# Patient Record
Sex: Female | Born: 1950
Health system: Southern US, Community
[De-identification: ages and names within clinical notes are randomized; demographics above are authoritative.]

## PROBLEM LIST (undated history)

## (undated) DIAGNOSIS — K219 Gastro-esophageal reflux disease without esophagitis: Secondary | ICD-10-CM

## (undated) DIAGNOSIS — E7800 Pure hypercholesterolemia, unspecified: Secondary | ICD-10-CM

## (undated) DIAGNOSIS — I10 Essential (primary) hypertension: Secondary | ICD-10-CM

## (undated) HISTORY — PX: ABDOMINAL HYSTERECTOMY: SHX81

---

## 2002-06-15 ENCOUNTER — Other Ambulatory Visit: Admission: RE | Admit: 2002-06-15 | Discharge: 2002-06-15 | Payer: Self-pay | Admitting: Obstetrics and Gynecology

## 2003-06-21 ENCOUNTER — Other Ambulatory Visit: Admission: RE | Admit: 2003-06-21 | Discharge: 2003-06-21 | Payer: Self-pay | Admitting: Obstetrics and Gynecology

## 2011-11-03 ENCOUNTER — Emergency Department (HOSPITAL_BASED_OUTPATIENT_CLINIC_OR_DEPARTMENT_OTHER)
Admission: EM | Admit: 2011-11-03 | Discharge: 2011-11-03 | Disposition: A | Discharge status: Home / Self Care | Payer: Managed Care, Other (non HMO) | Attending: Emergency Medicine | Admitting: Emergency Medicine

## 2011-11-03 ENCOUNTER — Emergency Department (INDEPENDENT_AMBULATORY_CARE_PROVIDER_SITE_OTHER): Payer: Managed Care, Other (non HMO)

## 2011-11-03 ENCOUNTER — Encounter: Payer: Self-pay | Admitting: *Deleted

## 2011-11-03 DIAGNOSIS — E7800 Pure hypercholesterolemia, unspecified: Secondary | ICD-10-CM | POA: Insufficient documentation

## 2011-11-03 DIAGNOSIS — W19XXXA Unspecified fall, initial encounter: Secondary | ICD-10-CM

## 2011-11-03 DIAGNOSIS — W010XXA Fall on same level from slipping, tripping and stumbling without subsequent striking against object, initial encounter: Secondary | ICD-10-CM | POA: Insufficient documentation

## 2011-11-03 DIAGNOSIS — M549 Dorsalgia, unspecified: Secondary | ICD-10-CM

## 2011-11-03 DIAGNOSIS — I10 Essential (primary) hypertension: Secondary | ICD-10-CM | POA: Insufficient documentation

## 2011-11-03 DIAGNOSIS — K219 Gastro-esophageal reflux disease without esophagitis: Secondary | ICD-10-CM | POA: Insufficient documentation

## 2011-11-03 DIAGNOSIS — S239XXA Sprain of unspecified parts of thorax, initial encounter: Secondary | ICD-10-CM | POA: Insufficient documentation

## 2011-11-03 DIAGNOSIS — M5137 Other intervertebral disc degeneration, lumbosacral region: Secondary | ICD-10-CM

## 2011-11-03 HISTORY — DX: Gastro-esophageal reflux disease without esophagitis: K21.9

## 2011-11-03 HISTORY — DX: Essential (primary) hypertension: I10

## 2011-11-03 HISTORY — DX: Pure hypercholesterolemia, unspecified: E78.00

## 2011-11-03 MED ORDER — OXYCODONE-ACETAMINOPHEN 5-325 MG PO TABS
1.0000 | ORAL_TABLET | Freq: Once | ORAL | Status: AC
  Administered 2011-11-03: 1 via ORAL
  Filled 2011-11-03: qty 1

## 2011-11-03 MED ORDER — CYCLOBENZAPRINE HCL 10 MG PO TABS: 10.0000 mg | ORAL_TABLET | Freq: Three times a day (TID) | ORAL | Status: AC | PRN

## 2011-11-03 MED ORDER — OXYCODONE-ACETAMINOPHEN 5-325 MG PO TABS: 1.0000 | ORAL_TABLET | ORAL | Status: AC | PRN

## 2011-11-03 NOTE — ED Notes (Signed)
Patient and family members given something to eat and drink, explained to patient and family delay and family expressed concern over not having something to eat during their visit here

## 2011-11-03 NOTE — ED Provider Notes (Signed)
History  Scribed for Karen Roberson, Roberson the patient was seen in [facility]. The chart was scribed by Gilman Schmidt. The patients care was started at 3:49 PM.   CSN: [id number] Arrival date & time: 11/03/2011  2:50 PM    Chief Complaint  Patient presents with  . Back Pain    HPI Karen Roberson is a 60 y.o. female who presents to the Emergency Department complaining of back pain. Pt states she fell getting out of the shower this morning around 9:30AM and injured her mid-back. States she feels a "grabbing sensation in back". Denies any loc, rib pain, neck pain or bleeding. Denies any previous injury. Pt has not tried OTC meds for relief. There are no other associated symptoms and no other alleviating or aggravating factors.   PCP: Dr. Joetta Manners  Past Medical History  Diagnosis Date  . Hypertension   . Hypercholesteremia   . Acid reflux     Past Surgical History  Procedure Date  . Abdominal hysterectomy     History reviewed. No pertinent family history.  History  Substance Use Topics  . Smoking status: Never Smoker   . Smokeless tobacco: Not on file  . Alcohol Use: No     Review of Systems  HENT: Negative for neck pain and neck stiffness.   Cardiovascular: Negative for chest pain.  Gastrointestinal: Negative for nausea, vomiting and diarrhea.  Musculoskeletal: Positive for back pain.  Skin: Negative for wound.  Neurological: Negative for syncope and headaches.  All other systems reviewed and are negative.    Allergies  Review of patient's allergies indicates no known allergies.  Home Medications   Current Outpatient Rx  Name Route Sig Dispense Refill  . ASPIRIN 81 MG PO TABS Oral Take 81 mg by mouth daily.      Marland Kitchen LISINOPRIL 40 MG PO TABS Oral Take 40 mg by mouth daily.      Marland Kitchen OMEPRAZOLE 40 MG PO CPDR Oral Take 40 mg by mouth daily.      Marland Kitchen ROSUVASTATIN CALCIUM 10 MG PO TABS Oral Take 5 mg by mouth daily.        BP 143/82  Pulse 84  Temp(Src) 98.2 F (36.8 C)  (Oral)  Resp 20  Ht 5\' 8"  (1.727 m)  Wt 197 lb (89.359 kg)  BMI 29.95 kg/m2  SpO2 99%  Physical Exam  Constitutional: She is oriented to person, place, and time. She appears well-developed and well-nourished.  Non-toxic appearance. She does not have a sickly appearance.  Eyes: Lids are normal. No scleral icterus.  Neck: Trachea normal and normal range of motion. Neck supple.  Cardiovascular: Regular rhythm and normal heart sounds.   Pulmonary/Chest: Effort normal and breath sounds normal.  Abdominal: Soft. Normal appearance. There is no tenderness. There is no rebound, no guarding and no CVA tenderness.  Musculoskeletal: Normal range of motion.       Thoracic back: She exhibits tenderness.       Lumbar back: She exhibits tenderness.  Neurological: She is alert and oriented to person, place, and time. She has normal strength.       Normal sensation in legs  Skin: Skin is warm, dry and intact. No rash noted.    ED Course  Procedures DIAGNOSTIC STUDIES: Oxygen Saturation is 99% on room air, normal by my interpretation.    COORDINATION OF CARE: 3:49PM:  - Patient evaluated by ED physician, Percocet, DG Thoracic Spine, DG Lumbar Spine ordered 5:40PM: Recheck by EDP. Sx improved. Results  reviewed with pt.   RADIOLOGY:  DG Lumbar Spine 4 View. Reviewed by me. IMPRESSION: No acute or traumatic finding. 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 Chronic lower lumbar facet degeneration including degenerative spondylolisthesis at L5-S1. Original Report Authenticated By: Thomasenia Sales, M.D.  DG Thoracic Spine 4 View. Reviewed by me. IMPRESSION: No acute or traumatic finding. Chronic thoracic osteophytes. Original Report Authenticated By: Thomasenia Sales, M.D.  IMP: Thoracic muscle strain  Disp:  Rx Percocet for pain, Flexeril for muscle spasm, rest on a firm surface, can use a heating pad on a low setting.  I personally performed the services described in this  documentation, which was scribed in my presence. The recorded information has been reviewed and considered.  Osvaldo Human, M.D.      Karen Roberson III, MD 11/03/11 [phone]

## 2011-11-03 NOTE — ED Notes (Signed)
Pt states she fell getting out of the shower today and injured her mid-back. Denies other s/s. Went to church.

## 2012-01-09 IMAGING — CR DG LUMBAR SPINE COMPLETE 4+V
5 series · 5 of 5 positions shown · non-contrast
Comparison: None

CLINICAL DATA: Fall.  Back pain.

LUMBAR SPINE - COMPLETE 4+ VIEW

[t l-spine a.p.]
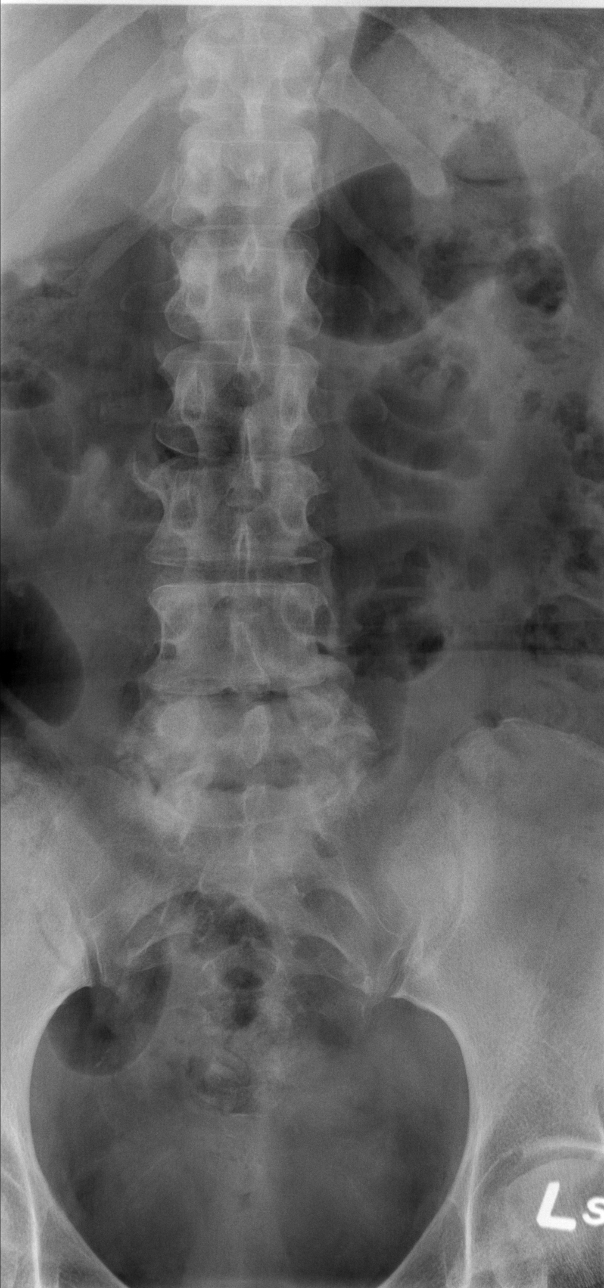

[t l-spine oblique exposure (1 of 2)]
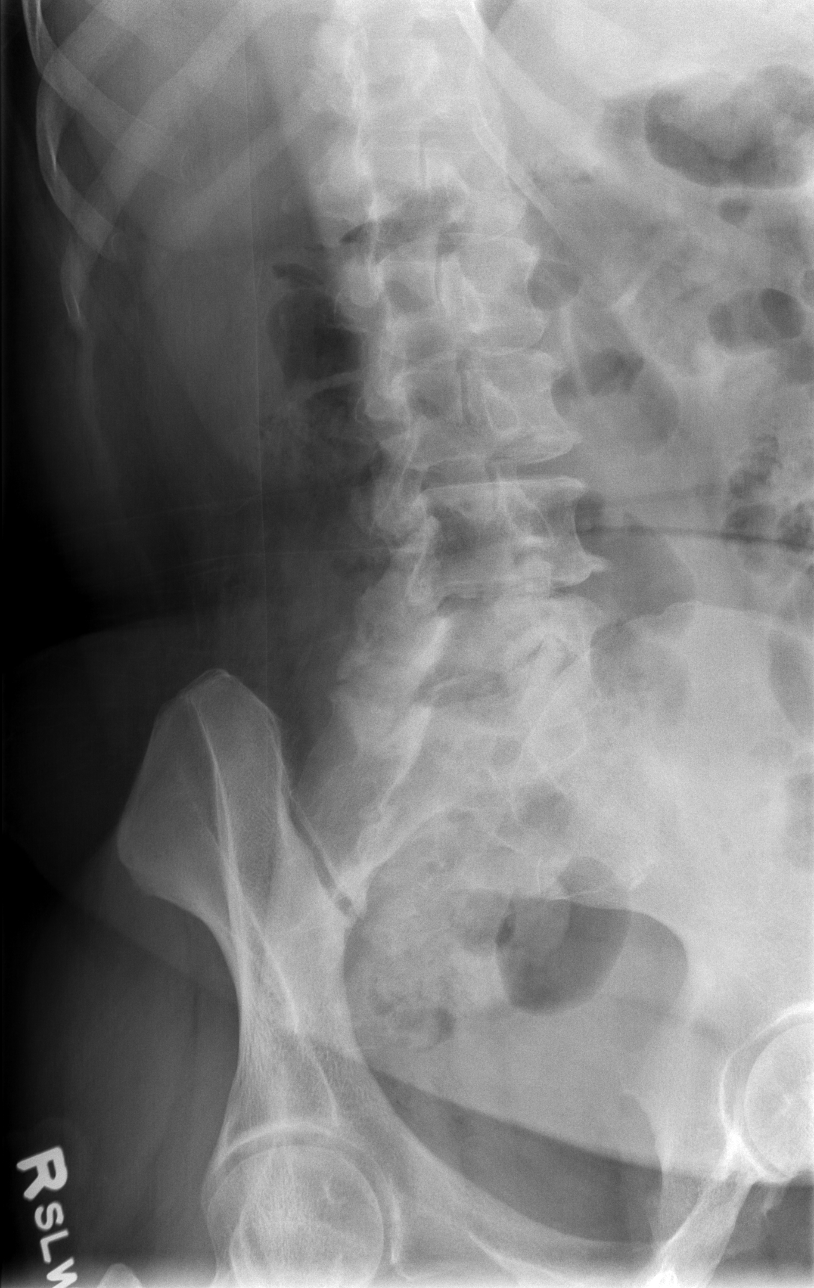

[t l-spine oblique exposure (2 of 2)]
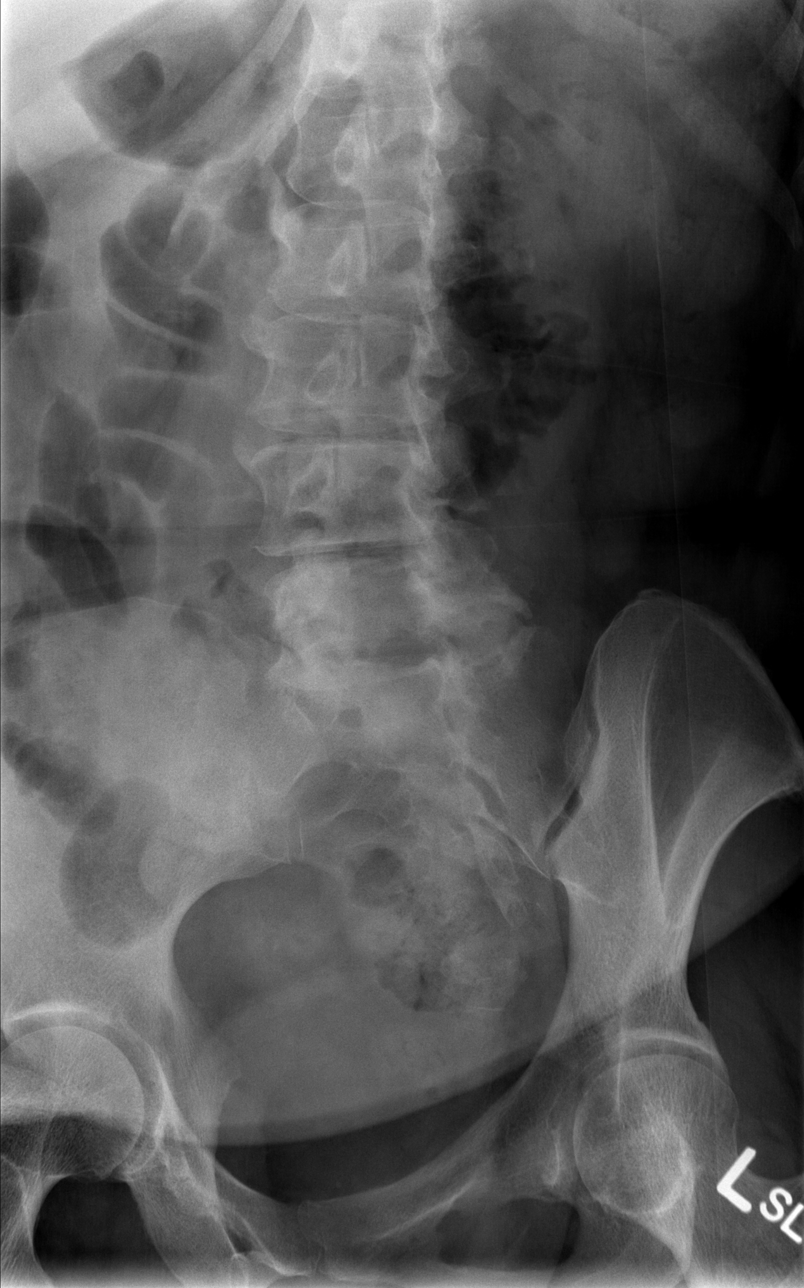

[t l-spine lat]
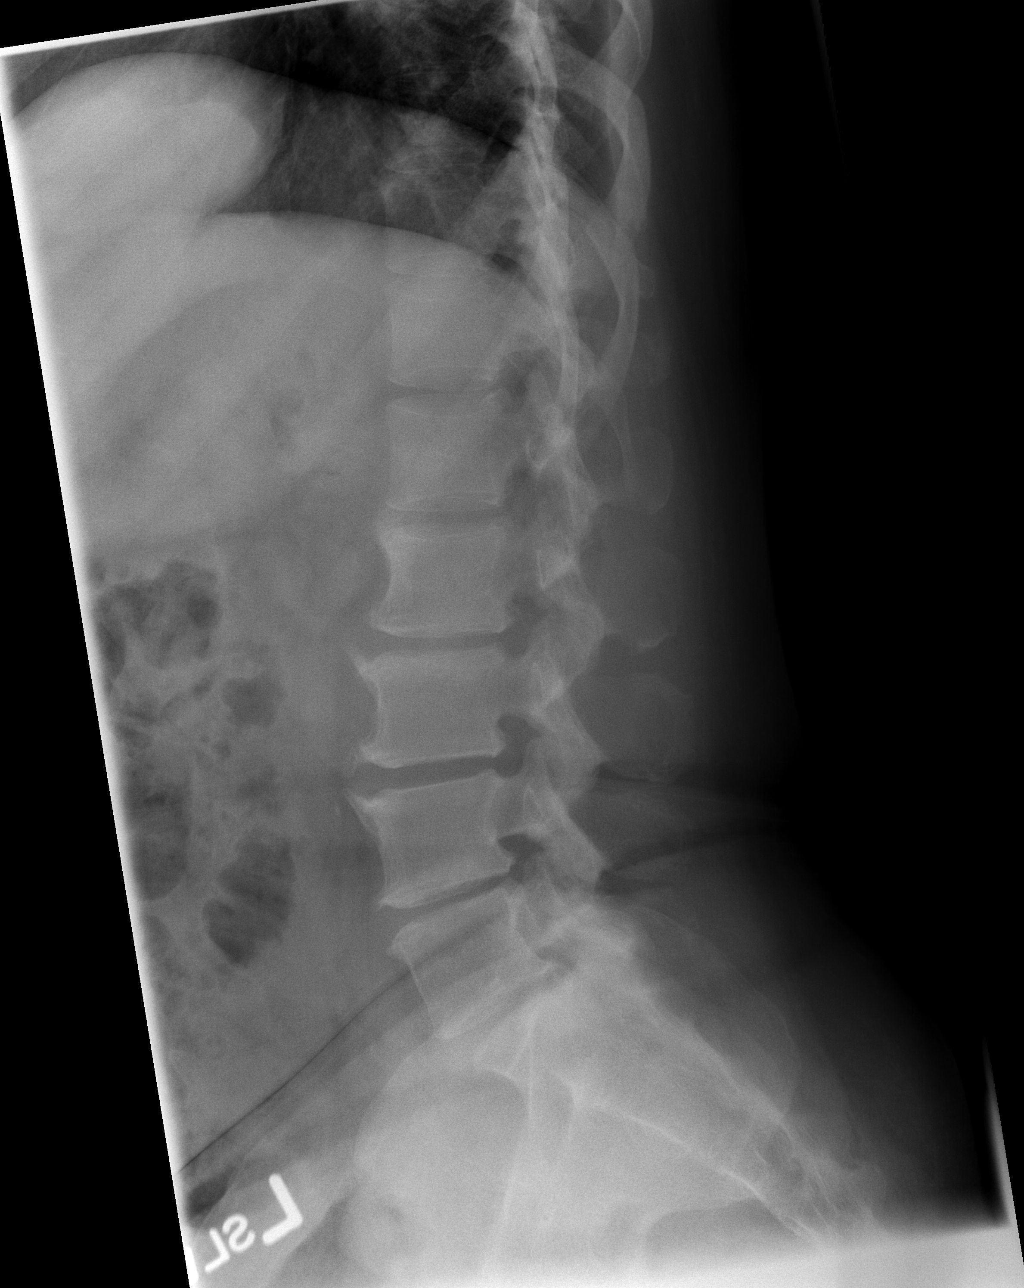

[t l-spine l5-s1 spot]
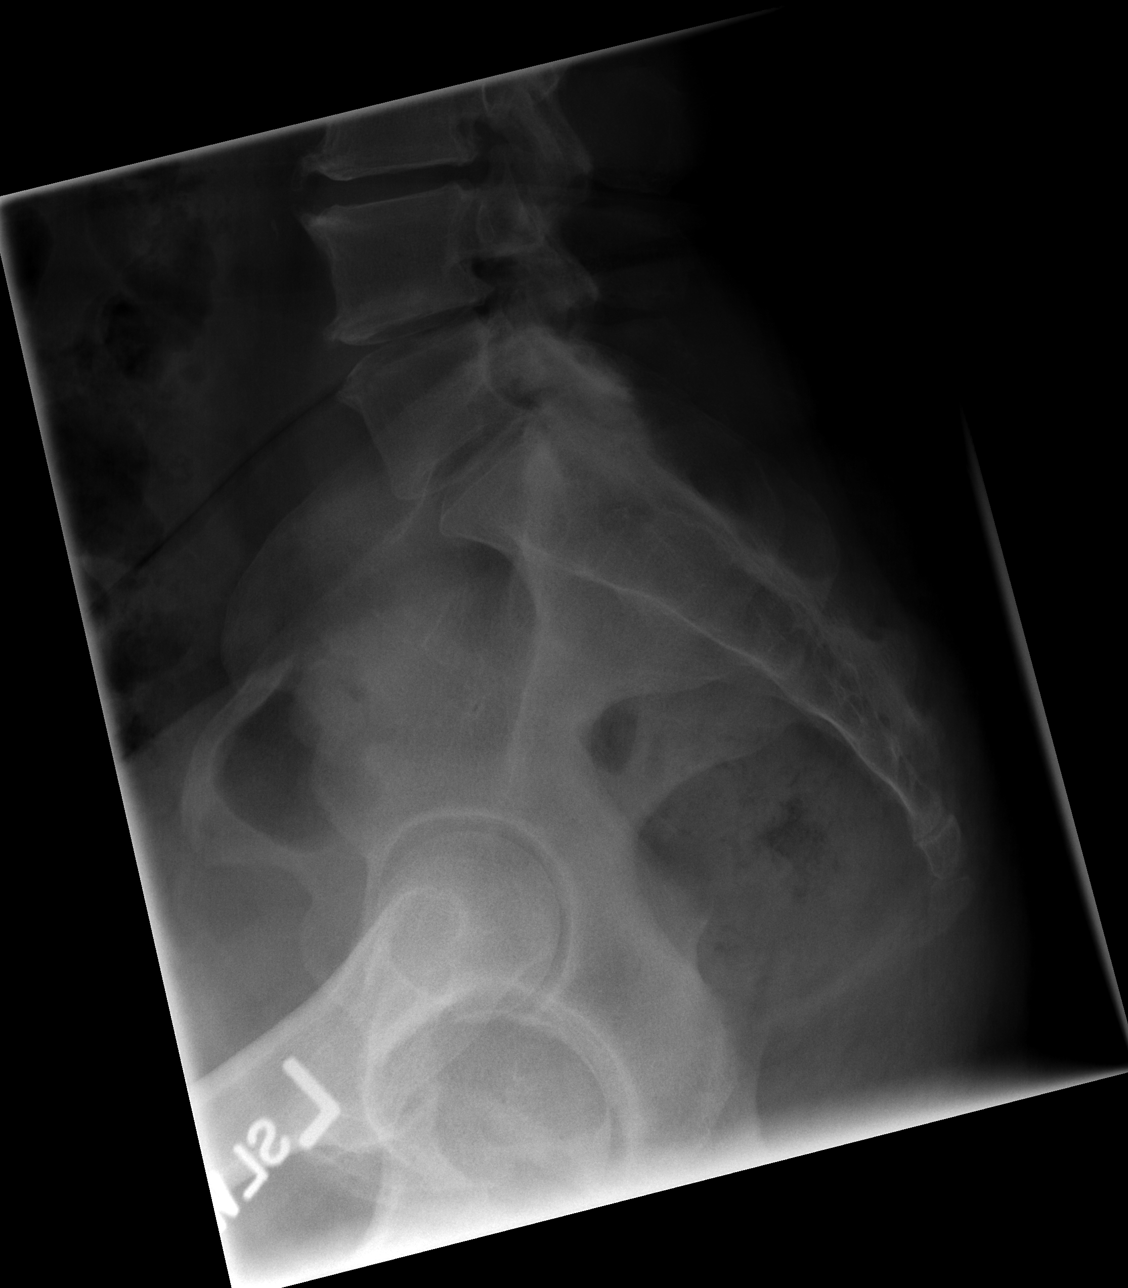

[5 of 5 positions shown; findings below may reference images not displayed]

FINDINGS: No fracture in the lumbar region.  There is disc space
narrowing throughout the lumbar region consistent with chronic
degenerative disc disease.  There is chronic degenerative facet
disease in the lower lumbar spine including fairly advanced facet
arthropathy at L4-5 and L5-S1 with 3 mm of anterolisthesis at L5-
S1.
IMPRESSION: No acute or traumatic finding.  Chronic degenerative disc disease
throughout the lumbar spine.  Chronic lower lumbar facet
degeneration including degenerative spondylolisthesis at L5-S1.

## 2017-01-23 ENCOUNTER — Emergency Department (HOSPITAL_BASED_OUTPATIENT_CLINIC_OR_DEPARTMENT_OTHER)
Admission: EM | Admit: 2017-01-23 | Discharge: 2017-01-23 | Disposition: A | Discharge status: Home / Self Care | Payer: Medicare HMO | Attending: Emergency Medicine | Admitting: Emergency Medicine

## 2017-01-23 ENCOUNTER — Encounter (HOSPITAL_BASED_OUTPATIENT_CLINIC_OR_DEPARTMENT_OTHER): Payer: Self-pay | Admitting: *Deleted

## 2017-01-23 DIAGNOSIS — I10 Essential (primary) hypertension: Secondary | ICD-10-CM | POA: Insufficient documentation

## 2017-01-23 DIAGNOSIS — Z7982 Long term (current) use of aspirin: Secondary | ICD-10-CM | POA: Insufficient documentation

## 2017-01-23 DIAGNOSIS — K529 Noninfective gastroenteritis and colitis, unspecified: Secondary | ICD-10-CM | POA: Insufficient documentation

## 2017-01-23 DIAGNOSIS — Z79899 Other long term (current) drug therapy: Secondary | ICD-10-CM | POA: Insufficient documentation

## 2017-01-23 DIAGNOSIS — R1110 Vomiting, unspecified: Secondary | ICD-10-CM | POA: Diagnosis present

## 2017-01-23 LAB — COMPREHENSIVE METABOLIC PANEL
ALBUMIN: 3.9 g/dL (ref 3.5–5.0)
ALT: 19 U/L (ref 14–54)
AST: 22 U/L (ref 15–41)
Alkaline Phosphatase: 73 U/L (ref 38–126)
Anion gap: 11 (ref 5–15)
BUN: 18 mg/dL (ref 6–20)
CHLORIDE: 103 mmol/L (ref 101–111)
CO2: 25 mmol/L (ref 22–32)
Calcium: 9 mg/dL (ref 8.9–10.3)
Creatinine, Ser: 0.68 mg/dL (ref 0.44–1.00)
GFR calc Af Amer: >60 mL/min (ref >60)
Glucose, Bld: 152 mg/dL — ABNORMAL HIGH (ref 65–99)
POTASSIUM: 3.1 mmol/L — AB (ref 3.5–5.1)
SODIUM: 139 mmol/L (ref 135–145)
Total Bilirubin: 0.6 mg/dL (ref 0.3–1.2)
Total Protein: 8.1 g/dL (ref 6.5–8.1)

## 2017-01-23 LAB — URINALYSIS, MICROSCOPIC (REFLEX)

## 2017-01-23 LAB — URINALYSIS, ROUTINE W REFLEX MICROSCOPIC
GLUCOSE, UA: NEGATIVE mg/dL
Ketones, ur: NEGATIVE mg/dL
LEUKOCYTES UA: NEGATIVE
Nitrite: NEGATIVE
PROTEIN: 100 mg/dL — AB
SPECIFIC GRAVITY, URINE: 1.03 (ref 1.005–1.030)
pH: 6 (ref 5.0–8.0)

## 2017-01-23 LAB — CBC WITH DIFFERENTIAL/PLATELET
BASOS ABS: 0 10*3/uL (ref 0.0–0.1)
BASOS PCT: 0 %
EOS ABS: 0 10*3/uL (ref 0.0–0.7)
EOS PCT: 0 %
HCT: 41.5 % (ref 36.0–46.0)
Hemoglobin: 13.7 g/dL (ref 12.0–15.0)
Lymphocytes Relative: 4 %
Lymphs Abs: 0.3 10*3/uL — ABNORMAL LOW (ref 0.7–4.0)
MCH: 29.8 pg (ref 26.0–34.0)
MCHC: 33 g/dL (ref 30.0–36.0)
MCV: 90.2 fL (ref 78.0–100.0)
MONO ABS: 0.3 10*3/uL (ref 0.1–1.0)
Monocytes Relative: 4 %
Neutro Abs: 7.5 10*3/uL (ref 1.7–7.7)
Neutrophils Relative %: 92 %
PLATELETS: 310 10*3/uL (ref 150–400)
RBC: 4.6 MIL/uL (ref 3.87–5.11)
RDW: 13.2 % (ref 11.5–15.5)
WBC: 8.1 10*3/uL (ref 4.0–10.5)

## 2017-01-23 LAB — LIPASE, BLOOD: LIPASE: 26 U/L (ref 11–51)

## 2017-01-23 LAB — I-STAT CG4 LACTIC ACID, ED: Lactic Acid, Venous: 1.58 mmol/L (ref 0.5–1.9)

## 2017-01-23 MED ORDER — DICYCLOMINE HCL 20 MG PO TABS: 20.0000 mg | ORAL_TABLET | Freq: Two times a day (BID) | ORAL | 0 refills | Status: AC | PRN

## 2017-01-23 MED ORDER — ONDANSETRON HCL 4 MG/2ML IJ SOLN
4.0000 mg | Freq: Once | INTRAMUSCULAR | Status: AC
  Administered 2017-01-23: 4 mg via INTRAVENOUS
  Filled 2017-01-23: qty 2

## 2017-01-23 MED ORDER — DICYCLOMINE HCL 10 MG/ML IM SOLN
20.0000 mg | Freq: Once | INTRAMUSCULAR | Status: AC
  Administered 2017-01-23: 20 mg via INTRAMUSCULAR
  Filled 2017-01-23: qty 2

## 2017-01-23 MED ORDER — ONDANSETRON 4 MG PO TBDP: 4.0000 mg | ORAL_TABLET | Freq: Three times a day (TID) | ORAL | 0 refills | Status: AC | PRN

## 2017-01-23 MED ORDER — SODIUM CHLORIDE 0.9 % IV BOLUS (SEPSIS)
1000.0000 mL | INTRAVENOUS | Status: AC
  Administered 2017-01-23: 1000 mL via INTRAVENOUS

## 2017-01-23 MED ORDER — KETOROLAC TROMETHAMINE 15 MG/ML IJ SOLN
15.0000 mg | Freq: Once | INTRAMUSCULAR | Status: AC
  Administered 2017-01-23: 15 mg via INTRAVENOUS
  Filled 2017-01-23: qty 1

## 2017-01-23 MED FILL — ONDANSETRON ODT 4 MG TABLET: 4 | 4 days supply | Qty: 10 | Fill #0

## 2017-01-23 MED FILL — DICYCLOMINE 20 MG TABLET: 20 | 10 days supply | Qty: 20 | Fill #0

## 2017-01-23 NOTE — Discharge Instructions (Signed)
Drink plenty of clear liquids to prevent dehydration. Use Zofran as prescribed for nausea/vomiting. You may take Bentyl or over-the-counter Tylenol for abdominal cramping. Follow-up with your primary care doctor to ensure resolution of symptoms. You may return for new or concerning symptoms.

## 2017-01-23 NOTE — ED Notes (Signed)
Pt was able to drink gingerale.

## 2017-01-23 NOTE — ED Provider Notes (Signed)
[facility]   CSN: [id number] Arrival date & time: 01/23/17  0900     History   Chief Complaint Chief Complaint  Patient presents with  . Emesis    HPI Karen Roberson is a 65 y.o. female.  66 year old female with a history of dyslipidemia and hypertension presents to the emergency department for evaluation of vomiting and diarrhea which began at midnight. She reports approximately 6 episodes of vomiting and 2 episodes of watery, nonbloody diarrhea. Patient has not taken any medications for symptoms, though she was able to tolerate her blood pressure medication this morning. She tried drinking ginger ale, but vomited shortly after this. She denies any recent sick contacts. No known fevers. No urinary complaints. She does report some abdominal discomfort primarily in her epigastrium. She has an abdominal surgical history significant for abdominal hysterectomy and appendectomy.   The history is provided by the patient and a relative. No language interpreter was used.  Emesis      Past Medical History:  Diagnosis Date  . Acid reflux   . Hypercholesteremia   . Hypertension     There are no active problems to display for this patient.   Past Surgical History:  Procedure Laterality Date  . ABDOMINAL HYSTERECTOMY      OB History    No data available       Home Medications    Prior to Admission medications   Medication Sig Start Date End Date Taking? Authorizing Provider  simvastatin (ZOCOR) 10 MG tablet Take 10 mg by mouth daily.   Yes Historical Provider, MD  aspirin 81 MG tablet Take 81 mg by mouth daily.      Historical Provider, MD  dicyclomine (BENTYL) 20 MG tablet Take 1 tablet (20 mg total) by mouth 2 (two) times daily as needed (abdominal pain/cramping). 01/23/17   Antony Madura, PA-C  lisinopril (PRINIVIL,ZESTRIL) 40 MG tablet Take 40 mg by mouth daily.      Historical Provider, MD  ondansetron (ZOFRAN ODT) 4 MG disintegrating tablet Take  1 tablet (4 mg total) by mouth every 8 (eight) hours as needed for nausea or vomiting. 01/23/17   Antony Madura, PA-C    Family History History reviewed. No pertinent family history.  Social History Social History  Substance Use Topics  . Smoking status: Never Smoker  . Smokeless tobacco: Never Used  . Alcohol use No     Allergies   Patient has no known allergies.   Review of Systems Review of Systems  Gastrointestinal: Positive for vomiting.  Ten systems reviewed and are negative for acute change, except as noted in the HPI.     Physical Exam Updated Vital Signs BP 139/78 (BP Location: Left Arm)   Pulse 105   Temp 99.4 F (37.4 C) (Oral)   Resp 18   Ht 5' 8.25" (1.734 m)   Wt 93 kg   SpO2 96%   BMI 30.94 kg/m   Physical Exam  Constitutional: She is oriented to person, place, and time. She appears well-developed and well-nourished. No distress.  HENT:  Head: Normocephalic and atraumatic.  Eyes: Conjunctivae and EOM are normal. No scleral icterus.  Neck: Normal range of motion.  Cardiovascular: Regular rhythm and intact distal pulses.   Tachycardia  Pulmonary/Chest: Effort normal. No respiratory distress. She has no wheezes. She has no rales.  Lungs grossly clear bilaterally. Chest expansion symmetric  Abdominal: Soft. She exhibits no mass. There is tenderness. There is no guarding.  Soft, obese abdomen. There is tenderness to palpation in the epigastric region. No masses or peritoneal signs.  Musculoskeletal: Normal range of motion.  Neurological: She is alert and oriented to person, place, and time. She exhibits normal muscle tone. Coordination normal.  GCS 15. Patient moving all extremities.  Skin: Skin is warm and dry. No rash noted. She is not diaphoretic. No erythema. No pallor.  Psychiatric: She has a normal mood and affect. Her behavior is normal.  Nursing note and vitals reviewed.    ED Treatments / Results  Labs (all labs ordered are listed, but  only abnormal results are displayed) Labs Reviewed  CBC WITH DIFFERENTIAL/PLATELET - Abnormal; Notable for the following:       Result Value   Lymphs Abs 0.3 (*)    All other components within normal limits  COMPREHENSIVE METABOLIC PANEL - Abnormal; Notable for the following:    Potassium 3.1 (*)    Glucose, Bld 152 (*)    All other components within normal limits  URINALYSIS, ROUTINE W REFLEX MICROSCOPIC - Abnormal; Notable for the following:    APPearance CLOUDY (*)    Hgb urine dipstick LARGE (*)    Bilirubin Urine SMALL (*)    Protein, ur 100 (*)    All other components within normal limits  URINALYSIS, MICROSCOPIC (REFLEX) - Abnormal; Notable for the following:    Bacteria, UA MANY (*)    Squamous Epithelial / LPF 0-5 (*)    All other components within normal limits  LIPASE, BLOOD  I-STAT CG4 LACTIC ACID, ED  I-STAT CG4 LACTIC ACID, ED    EKG  EKG Interpretation None       Radiology No results found.  Procedures Procedures (including critical care time)  Medications Ordered in ED Medications  sodium chloride 0.9 % bolus 1,000 mL (0 mLs Intravenous Stopped 01/23/17 1058)  ondansetron (ZOFRAN) injection 4 mg (4 mg Intravenous Given 01/23/17 0950)  ketorolac (TORADOL) 15 MG/ML injection 15 mg (15 mg Intravenous Given 01/23/17 1028)  sodium chloride 0.9 % bolus 1,000 mL (1,000 mLs Intravenous New Bag/Given 01/23/17 1058)  dicyclomine (BENTYL) injection 20 mg (20 mg Intramuscular Given 01/23/17 1059)     Initial Impression / Assessment and Plan / ED Course  I have reviewed the triage vital signs and the nursing notes.  Pertinent labs & imaging results that were available during my care of the patient were reviewed by me and considered in my medical decision making (see chart for details).     10:49 AM Patient reassessed. Labs reassuring. She reports lessening of epigastric abdominal pain. Abdominal exam stable. HR improving with IVF. Additional bolus  ordered.  12:32 PM Patient expresses to RN that she is feeling better. She is tolerating POs without vomiting. HR has greatly improved with IVF. Symptoms consistent with viral gastroenteritis. No fever. Lungs are clear. Labs reassuring. No leukocytosis. Supportive therapy indicated with return if symptoms worsen. PCP f/u advised and return precautions given. Patient discharged in stable condition with no unaddressed concerns.   Final Clinical Impressions(s) / ED Diagnoses   Final diagnoses:  Gastroenteritis    New Prescriptions New Prescriptions   DICYCLOMINE (BENTYL) 20 MG TABLET    Take 1 tablet (20 mg total) by mouth 2 (two) times daily as needed (abdominal pain/cramping).   ONDANSETRON (ZOFRAN ODT) 4 MG DISINTEGRATING TABLET    Take 1 tablet (4 mg total) by mouth every 8 (eight) hours as needed for nausea or vomiting.     Antony MaduraKelly Leroy Trim, PA-C  01/23/17 1234    Jerelyn Scott, MD 01/23/17 1245

## 2017-01-23 NOTE — ED Triage Notes (Signed)
Pt reports sudden onset of vomiting and diarrhea at around midnight. Pt also reports abd is "sore all over".

## 2018-02-18 ENCOUNTER — Other Ambulatory Visit: Payer: Self-pay

## 2018-02-18 ENCOUNTER — Emergency Department (HOSPITAL_BASED_OUTPATIENT_CLINIC_OR_DEPARTMENT_OTHER)
Admission: EM | Admit: 2018-02-18 | Discharge: 2018-02-18 | Disposition: A | Discharge status: Home / Self Care | Payer: Medicare HMO | Attending: Emergency Medicine | Admitting: Emergency Medicine

## 2018-02-18 ENCOUNTER — Encounter (HOSPITAL_BASED_OUTPATIENT_CLINIC_OR_DEPARTMENT_OTHER): Payer: Self-pay | Admitting: Emergency Medicine

## 2018-02-18 DIAGNOSIS — Z7982 Long term (current) use of aspirin: Secondary | ICD-10-CM | POA: Diagnosis not present

## 2018-02-18 DIAGNOSIS — R42 Dizziness and giddiness: Secondary | ICD-10-CM | POA: Diagnosis present

## 2018-02-18 DIAGNOSIS — I10 Essential (primary) hypertension: Secondary | ICD-10-CM | POA: Insufficient documentation

## 2018-02-18 DIAGNOSIS — H81399 Other peripheral vertigo, unspecified ear: Secondary | ICD-10-CM | POA: Diagnosis not present

## 2018-02-18 DIAGNOSIS — Z79899 Other long term (current) drug therapy: Secondary | ICD-10-CM | POA: Diagnosis not present

## 2018-02-18 DIAGNOSIS — R0981 Nasal congestion: Secondary | ICD-10-CM | POA: Insufficient documentation

## 2018-02-18 LAB — CBC WITH DIFFERENTIAL/PLATELET
Basophils Absolute: 0 10*3/uL (ref 0.0–0.1)
Basophils Relative: 0 %
Eosinophils Absolute: 0.1 10*3/uL (ref 0.0–0.7)
Eosinophils Relative: 1 %
HCT: 38.1 % (ref 36.0–46.0)
Hemoglobin: 12.4 g/dL (ref 12.0–15.0)
Lymphocytes Relative: 34 %
Lymphs Abs: 2 10*3/uL (ref 0.7–4.0)
MCH: 30 pg (ref 26.0–34.0)
MCHC: 32.5 g/dL (ref 30.0–36.0)
MCV: 92.3 fL (ref 78.0–100.0)
Monocytes Absolute: 0.5 10*3/uL (ref 0.1–1.0)
Monocytes Relative: 8 %
Neutro Abs: 3.3 10*3/uL (ref 1.7–7.7)
Neutrophils Relative %: 57 %
Platelets: 325 10*3/uL (ref 150–400)
RBC: 4.13 MIL/uL (ref 3.87–5.11)
RDW: 13.8 % (ref 11.5–15.5)
WBC: 5.9 10*3/uL (ref 4.0–10.5)

## 2018-02-18 LAB — URINALYSIS, ROUTINE W REFLEX MICROSCOPIC
Bilirubin Urine: NEGATIVE
Glucose, UA: NEGATIVE mg/dL
Ketones, ur: NEGATIVE mg/dL
Leukocytes, UA: NEGATIVE
Nitrite: NEGATIVE
Protein, ur: NEGATIVE mg/dL
Specific Gravity, Urine: 1.01 (ref 1.005–1.030)
pH: 6 (ref 5.0–8.0)

## 2018-02-18 LAB — BASIC METABOLIC PANEL
Anion gap: 8 (ref 5–15)
BUN: 14 mg/dL (ref 6–20)
CO2: 29 mmol/L (ref 22–32)
Calcium: 9 mg/dL (ref 8.9–10.3)
Chloride: 100 mmol/L — ABNORMAL LOW (ref 101–111)
Creatinine, Ser: 0.87 mg/dL (ref 0.44–1.00)
GFR calc Af Amer: >60 mL/min (ref >60)
GFR calc non Af Amer: >60 mL/min (ref >60)
Glucose, Bld: 96 mg/dL (ref 65–99)
Potassium: 3.5 mmol/L (ref 3.5–5.1)
Sodium: 137 mmol/L (ref 135–145)

## 2018-02-18 LAB — URINALYSIS, MICROSCOPIC (REFLEX): WBC, UA: NONE SEEN WBC/hpf (ref 0–5)

## 2018-02-18 MED ORDER — DIAZEPAM 5 MG PO TABS: 5.0000 mg | ORAL_TABLET | Freq: Two times a day (BID) | ORAL | 0 refills | Status: AC | PRN

## 2018-02-18 MED ORDER — FLUTICASONE PROPIONATE 50 MCG/ACT NA SUSP: 2.0000 | Freq: Every day | NASAL | 0 refills | Status: AC

## 2018-02-18 MED ORDER — DIAZEPAM 5 MG PO TABS
5.0000 mg | ORAL_TABLET | Freq: Once | ORAL | Status: AC
  Administered 2018-02-18: 5 mg via ORAL
  Filled 2018-02-18: qty 1

## 2018-02-18 MED ORDER — CETIRIZINE HCL 10 MG PO TABS: 10.0000 mg | ORAL_TABLET | Freq: Every day | ORAL | 0 refills | Status: AC

## 2018-02-18 MED FILL — FLUTICASONE PROP 50 MCG SPR: 50 | 30 days supply | Qty: 16 | Fill #0

## 2018-02-18 MED FILL — CETIRIZINE HCL 10 MG TABS: 10 | 100 days supply | Qty: 100 | Fill #0

## 2018-02-18 MED FILL — diazePAM 5 MG TABS: 5 | 5 days supply | Qty: 10 | Fill #0

## 2018-02-18 NOTE — ED Provider Notes (Signed)
[facility] EMERGENCY DEPARTMENT Provider Note   CSN: [id number] Arrival date & time: 02/18/18  1133     History   Chief Complaint Chief Complaint  Patient presents with  . Dizziness    HPI Karen Roberson is a 67 y.o. female with history of hypertension, acid reflux who presents with recurrent dizziness.  Patient reports her symptoms started yesterday, however she had an episode of the same last month.  Patient has had room spinning dizziness with movement of her head.  At this time, she reports some mild lightheadedness, however she moves her head the vertigo begins.  She denies any numbness or tingling.  She has a slight headache.  She has taken over-the-counter meclizine with some relief, however she states she cannot work with her symptoms like this in once the vertigo completely gone.  She has had associated nasal congestion.  She saw her doctor last month who recommended meclizine.  HPI  Past Medical History:  Diagnosis Date  . Acid reflux   . Hypercholesteremia   . Hypertension     There are no active problems to display for this patient.   Past Surgical History:  Procedure Laterality Date  . ABDOMINAL HYSTERECTOMY      OB History    No data available       Home Medications    Prior to Admission medications   Medication Sig Start Date End Date Taking? Authorizing Provider  aspirin 81 MG tablet Take 81 mg by mouth daily.      [provider]  cetirizine (ZYRTEC ALLERGY) 10 MG tablet Take 1 tablet (10 mg total) by mouth daily. 02/18/18   Adriyanna Christians, Waylan BogaAlexandra M, PA-C  diazepam (VALIUM) 5 MG tablet Take 1 tablet (5 mg total) by mouth every 12 (twelve) hours as needed (dizziness). 02/18/18   Dalynn Jhaveri, Waylan BogaAlexandra M, PA-C  dicyclomine (BENTYL) 20 MG tablet Take 1 tablet (20 mg total) by mouth 2 (two) times daily as needed (abdominal pain/cramping). 01/23/17   Antony MaduraHumes, Kelly, PA-C  fluticasone (FLONASE) 50 MCG/ACT nasal spray Place 2 sprays into both nostrils daily.  02/18/18   Kaydee Magel, Waylan BogaAlexandra M, PA-C  lisinopril (PRINIVIL,ZESTRIL) 40 MG tablet Take 40 mg by mouth daily.      [provider]  ondansetron (ZOFRAN ODT) 4 MG disintegrating tablet Take 1 tablet (4 mg total) by mouth every 8 (eight) hours as needed for nausea or vomiting. 01/23/17   Antony MaduraHumes, Kelly, PA-C  simvastatin (ZOCOR) 10 MG tablet Take 10 mg by mouth daily.    [provider]    Family History No family history on file.  Social History Social History   Tobacco Use  . Smoking status: Never Smoker  . Smokeless tobacco: Never Used  Substance Use Topics  . Alcohol use: No  . Drug use: No     Allergies   Patient has no known allergies.   Review of Systems Review of Systems  Constitutional: Negative for chills and fever.  HENT: Negative for facial swelling and sore throat.   Respiratory: Negative for shortness of breath.   Cardiovascular: Negative for chest pain.  Gastrointestinal: Negative for abdominal pain, nausea and vomiting.  Genitourinary: Negative for dysuria.  Musculoskeletal: Negative for back pain.  Skin: Negative for rash and wound.  Neurological: Positive for dizziness, light-headedness and headaches. Negative for speech difficulty, weakness and numbness.  Psychiatric/Behavioral: The patient is not nervous/anxious.      Physical Exam Updated Vital Signs BP 121/68 (BP Location: Right Arm)  Pulse 78   Temp 97.9 F (36.6 C) (Oral)   Resp 16   Ht 5\' 8"  (1.727 m)   Wt 94.3 kg (207 lb 14.3 oz)   SpO2 98%   BMI 31.61 kg/m   Physical Exam  Constitutional: She appears well-developed and well-nourished. No distress.  HENT:  Head: Normocephalic and atraumatic.  Right Ear: Tympanic membrane is not injected, not erythematous, not retracted and not bulging. A middle ear effusion is present.  Left Ear: Tympanic membrane is not injected, not erythematous, not retracted and not bulging. A middle ear effusion is present.  Mouth/Throat: Oropharynx is  clear and moist. No oropharyngeal exudate.  Bilateral fluid effusions; no signs of otitis media  Eyes: Conjunctivae are normal. Pupils are equal, round, and reactive to light. Right eye exhibits no discharge. Left eye exhibits no discharge. No scleral icterus.  Neck: Normal range of motion. Neck supple. No thyromegaly present.  Cardiovascular: Normal rate, regular rhythm, normal heart sounds and intact distal pulses. Exam reveals no gallop and no friction rub.  No murmur heard. Pulmonary/Chest: Effort normal and breath sounds normal. No stridor. No respiratory distress. She has no wheezes. She has no rales.  Abdominal: Soft. Bowel sounds are normal. She exhibits no distension. There is no tenderness. There is no rebound and no guarding.  Musculoskeletal: She exhibits no edema.  Lymphadenopathy:    She has no cervical adenopathy.  Neurological: She is alert. Coordination normal.  CN 3-12 intact; no nystagmus noted; normal sensation throughout; 5/5 strength in all 4 extremities; equal bilateral grip strength; no ataxia on finger to nose  Skin: Skin is warm and dry. No rash noted. She is not diaphoretic. No pallor.  Psychiatric: She has a normal mood and affect.  Nursing note and vitals reviewed.    ED Treatments / Results  Labs (all labs ordered are listed, but only abnormal results are displayed) Labs Reviewed  BASIC METABOLIC PANEL - Abnormal; Notable for the following components:      Result Value   Chloride 100 (*)    All other components within normal limits  URINALYSIS, ROUTINE W REFLEX MICROSCOPIC - Abnormal; Notable for the following components:   Hgb urine dipstick SMALL (*)    All other components within normal limits  URINALYSIS, MICROSCOPIC (REFLEX) - Abnormal; Notable for the following components:   Bacteria, UA RARE (*)    Squamous Epithelial / LPF 0-5 (*)    All other components within normal limits  CBC WITH DIFFERENTIAL/PLATELET    EKG  EKG Interpretation None         Radiology No results found.  Procedures Procedures (including critical care time)  Medications Ordered in ED Medications  diazepam (VALIUM) tablet 5 mg (5 mg Oral Given 02/18/18 1426)     Initial Impression / Assessment and Plan / ED Course  I have reviewed the triage vital signs and the nursing notes.  Pertinent labs & imaging results that were available during my care of the patient were reviewed by me and considered in my medical decision making (see chart for details).     Patient with suspected peripheral vertigo potentially related to chronic nasal congestion and ear congestion.  Symptoms are not worsened with head movement.  Normal neuro exam without focal deficits.  No ataxia noted.  Resolved with Valium in the ED.  Will discharge home with Zyrtec, Flonase, Valium with follow-up to ENT.  Strict return precautions given.  Patient understands and agrees with plan.  Patient vitals stable throughout  ED course and discharged in satisfactory condition.  Patient also evaluated by Dr. Erma Heritage who guided the patient's management and agrees with plan.  Final Clinical Impressions(s) / ED Diagnoses   Final diagnoses:  Nasal congestion  Peripheral vertigo, unspecified laterality  Vertigo    ED Discharge Orders        Ordered    diazepam (VALIUM) 5 MG tablet  Every 12 hours PRN     02/18/18 1505    fluticasone (FLONASE) 50 MCG/ACT nasal spray  Daily     02/18/18 1505    cetirizine (ZYRTEC ALLERGY) 10 MG tablet  Daily     02/18/18 [address], PA-C 02/18/18 1513    Shaune Pollack, MD 02/19/18 [phone]

## 2018-02-18 NOTE — Discharge Instructions (Signed)
Medications: Flonase, Zyrtec, Valium  Treatment: Take Flonase once daily as prescribed for nasal congestion.  Take Zyrtec 1-2 times daily as needed for nasal congestion and ear congestion.  Take Valium twice daily as needed for dizziness.  Do not drive or operate machinery while taking this medication (Valium).  Follow-up: Please follow-up with the ear nose and throat doctor, Dr. Jearld FentonByers, for further evaluation and treatment of your dizziness.  Please return to the emergency department if you develop any new or worsening symptoms.

## 2018-02-18 NOTE — ED Triage Notes (Signed)
Pt having lightheadedness, and dizziness for two days.  Pt states that she took some meclizine at home this am and has improved some.  Pt states "I want it gone".  Negative BEFAST,  Negative LVO

## 2019-03-06 ENCOUNTER — Other Ambulatory Visit: Payer: Self-pay

## 2019-03-06 ENCOUNTER — Emergency Department (HOSPITAL_BASED_OUTPATIENT_CLINIC_OR_DEPARTMENT_OTHER): Payer: Medicare HMO

## 2019-03-06 ENCOUNTER — Emergency Department (HOSPITAL_BASED_OUTPATIENT_CLINIC_OR_DEPARTMENT_OTHER)
Admission: EM | Admit: 2019-03-06 | Discharge: 2019-03-06 | Disposition: A | Discharge status: Home / Self Care | Payer: Medicare HMO | Attending: Emergency Medicine | Admitting: Emergency Medicine

## 2019-03-06 ENCOUNTER — Encounter (HOSPITAL_BASED_OUTPATIENT_CLINIC_OR_DEPARTMENT_OTHER): Payer: Self-pay | Admitting: Emergency Medicine

## 2019-03-06 DIAGNOSIS — M79605 Pain in left leg: Secondary | ICD-10-CM | POA: Insufficient documentation

## 2019-03-06 DIAGNOSIS — I10 Essential (primary) hypertension: Secondary | ICD-10-CM | POA: Insufficient documentation

## 2019-03-06 DIAGNOSIS — R1032 Left lower quadrant pain: Secondary | ICD-10-CM | POA: Diagnosis present

## 2019-03-06 DIAGNOSIS — Z7982 Long term (current) use of aspirin: Secondary | ICD-10-CM | POA: Diagnosis not present

## 2019-03-06 DIAGNOSIS — Z79899 Other long term (current) drug therapy: Secondary | ICD-10-CM | POA: Insufficient documentation

## 2019-03-06 DIAGNOSIS — R109 Unspecified abdominal pain: Secondary | ICD-10-CM

## 2019-03-06 LAB — URINALYSIS, MICROSCOPIC (REFLEX): WBC, UA: NONE SEEN WBC/hpf (ref 0–5)

## 2019-03-06 LAB — URINALYSIS, ROUTINE W REFLEX MICROSCOPIC
BILIRUBIN URINE: NEGATIVE
Glucose, UA: NEGATIVE mg/dL
Ketones, ur: NEGATIVE mg/dL
Leukocytes,Ua: NEGATIVE
NITRITE: NEGATIVE
PH: 7 (ref 5.0–8.0)
Protein, ur: NEGATIVE mg/dL
SPECIFIC GRAVITY, URINE: 1.02 (ref 1.005–1.030)

## 2019-03-06 MED ORDER — OXYCODONE-ACETAMINOPHEN 5-325 MG PO TABS
1.0000 | ORAL_TABLET | Freq: Once | ORAL | Status: AC
  Administered 2019-03-06: 1 via ORAL
  Filled 2019-03-06: qty 1

## 2019-03-06 MED ORDER — ACETAMINOPHEN 325 MG PO TABS
650.0000 mg | ORAL_TABLET | Freq: Once | ORAL | Status: AC
  Administered 2019-03-06: 650 mg via ORAL
  Filled 2019-03-06: qty 2

## 2019-03-06 NOTE — ED Provider Notes (Signed)
[facility] EMERGENCY DEPARTMENT Provider Note   CSN: [id number] Arrival date & time: 03/06/19  2836    History   Chief Complaint Chief Complaint  Patient presents with  . Back Pain    HPI Karen Roberson is a 67 y.o. female.     HPI [age]-year-old female presents the emergency department with left lower quadrant abdominal pain with some radiation to the left anterior thigh in the left flank.  No prior history of kidney stones.  Pain is constant.  Her symptoms have been bothering her since yesterday.  Denies nausea vomiting.  No fevers or chills.  No dysuria or urinary frequency.  No posterior buttock or thigh pain.  No weakness of the leg.  No pain down into her lower leg.  Simply left groin and left proximal anterior thigh pain.  No rash.   Past Medical History:  Diagnosis Date  . Acid reflux   . Hypercholesteremia   . Hypertension     There are no active problems to display for this patient.   Past Surgical History:  Procedure Laterality Date  . ABDOMINAL HYSTERECTOMY       OB History   No obstetric history on file.      Home Medications    Prior to Admission medications   Medication Sig Start Date End Date Taking? Authorizing Provider  aspirin 81 MG tablet Take 81 mg by mouth daily.     Yes [provider]  gabapentin (NEURONTIN) 300 MG capsule Take 300 mg by mouth 3 (three) times daily.   Yes [provider]  cetirizine (ZYRTEC ALLERGY) 10 MG tablet Take 1 tablet (10 mg total) by mouth daily. 02/18/18   Law, Waylan Boga, PA-C  diazepam (VALIUM) 5 MG tablet Take 1 tablet (5 mg total) by mouth every 12 (twelve) hours as needed (dizziness). 02/18/18   Law, Waylan Boga, PA-C  dicyclomine (BENTYL) 20 MG tablet Take 1 tablet (20 mg total) by mouth 2 (two) times daily as needed (abdominal pain/cramping). 01/23/17   Antony Madura, PA-C  fluticasone (FLONASE) 50 MCG/ACT nasal spray Place 2 sprays into both nostrils daily. 02/18/18   Law, Waylan Boga, PA-C  lisinopril (PRINIVIL,ZESTRIL) 40 MG tablet Take 40 mg by mouth daily.      [provider]  ondansetron (ZOFRAN ODT) 4 MG disintegrating tablet Take 1 tablet (4 mg total) by mouth every 8 (eight) hours as needed for nausea or vomiting. 01/23/17   Antony Madura, PA-C  simvastatin (ZOCOR) 10 MG tablet Take 10 mg by mouth daily.    [provider]    Family History No family history on file.  Social History Social History   Tobacco Use  . Smoking status: Never Smoker  . Smokeless tobacco: Never Used  Substance Use Topics  . Alcohol use: No  . Drug use: No     Allergies   Patient has no known allergies.   Review of Systems Review of Systems  All other systems reviewed and are negative.    Physical Exam Updated Vital Signs BP (!) 145/80 (BP Location: Right Arm)   Pulse 79   Temp 98.4 F (36.9 C) (Oral)   Resp 20   Ht 5\' 8"  (1.727 m)   Wt 90.7 kg   SpO2 98%   BMI 30.41 kg/m   Physical Exam Vitals signs and nursing note reviewed.  Constitutional:      General: She is not in acute distress.    Appearance: She is  well-developed.  HENT:     Head: Normocephalic and atraumatic.  Neck:     Musculoskeletal: Normal range of motion.  Cardiovascular:     Rate and Rhythm: Normal rate and regular rhythm.     Heart sounds: Normal heart sounds.  Pulmonary:     Effort: Pulmonary effort is normal.     Breath sounds: Normal breath sounds.  Abdominal:     General: There is no distension.     Palpations: Abdomen is soft.     Tenderness: There is no abdominal tenderness.  Musculoskeletal:     Comments: Full range of motion of left hip, left knee, left ankle.  Normal PT and DP pulse in the left foot.  No erythema or warmth of the left anterior thigh.  No focal tenderness.  Skin:    General: Skin is warm and dry.  Neurological:     Mental Status: She is alert and oriented to person, place, and time.  Psychiatric:        Judgment: Judgment normal.       ED Treatments / Results  Labs (all labs ordered are listed, but only abnormal results are displayed) Labs Reviewed  URINALYSIS, ROUTINE W REFLEX MICROSCOPIC - Abnormal; Notable for the following components:      Result Value   Hgb urine dipstick MODERATE (*)    All other components within normal limits  URINALYSIS, MICROSCOPIC (REFLEX) - Abnormal; Notable for the following components:   Bacteria, UA RARE (*)    All other components within normal limits    EKG None  Radiology No results found.  Procedures Procedures (including critical care time)  Medications Ordered in ED Medications  oxyCODONE-acetaminophen (PERCOCET/ROXICET) 5-325 MG per tablet 1 tablet (1 tablet Oral Given 03/06/19 1002)  acetaminophen (TYLENOL) tablet 650 mg (650 mg Oral Given 03/06/19 1002)     Initial Impression / Assessment and Plan / ED Course  I have reviewed the triage vital signs and the nursing notes.  Pertinent labs & imaging results that were available during my care of the patient were reviewed by me and considered in my medical decision making (see chart for details).        Hematuria noted on urinalysis.  Normal left lower extremity.  Low suspicion for sciatica given anterior left thigh pain.  Still could be peripheral nerve related.  Given her hematuria she will undergo CT imaging at this time  Final Clinical Impressions(s) / ED Diagnoses   Final diagnoses:  None    ED Discharge Orders    None       Azalia Bilis, MD 03/06/19 [phone]

## 2019-03-06 NOTE — Discharge Instructions (Addendum)
Please take ibuprofen and Tylenol for the pain.  Apply heat as needed.  Rest and take it easy through the weekend.  Please return the emergency department for new or worsening symptoms

## 2019-03-06 NOTE — ED Triage Notes (Signed)
L low back pain radiating into L leg since yesterday. Denies injury.

## 2019-03-06 NOTE — ED Notes (Signed)
Pt verbalized understanding of dc instructions.

## 2019-05-12 IMAGING — CT CT RENAL STONE PROTOCOL
2 of 4 series · 17 of 46 positions shown, 19 images · non-contrast
Comparison: None.

CLINICAL DATA: Left hip and left leg pain, chronic hematuria,
history of hysterectomy and appendectomy

EXAM:
CT ABDOMEN AND PELVIS WITHOUT CONTRAST
TECHNIQUE: Multidetector CT imaging of the abdomen and pelvis was performed
following the standard protocol without IV contrast.

[Series 2: axial st · axial · 0.89mm/px · z∈[-427,-32]mm · 14 of 87 slices shown, 16 images]
[im 4/87  soft-tissue]
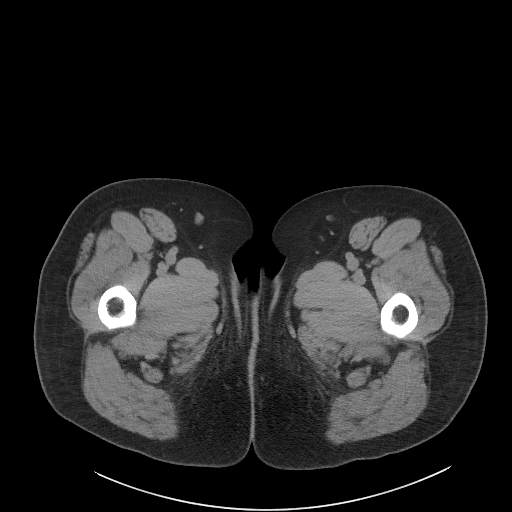
[im 4/87  bone]
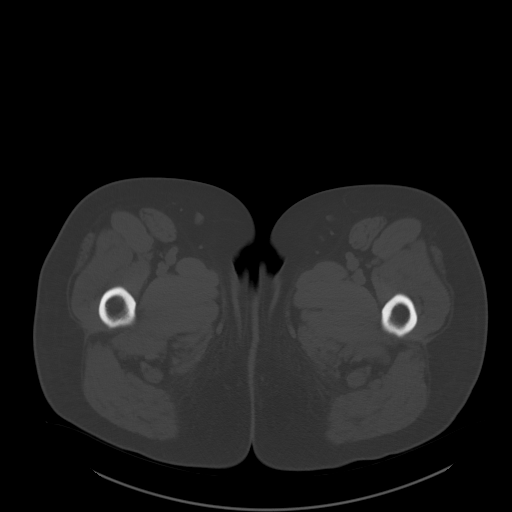
[im 11/87  soft-tissue]
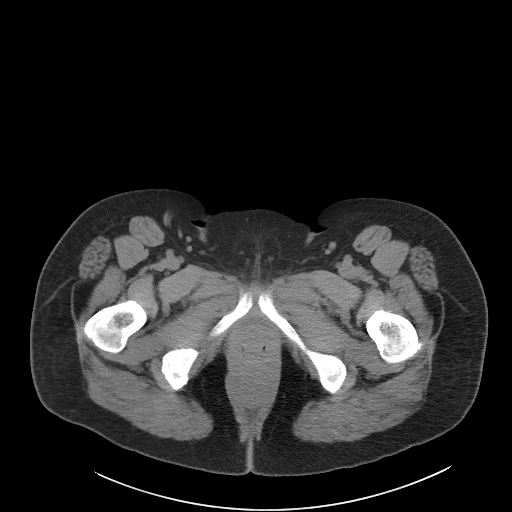
[im 18/87  soft-tissue]
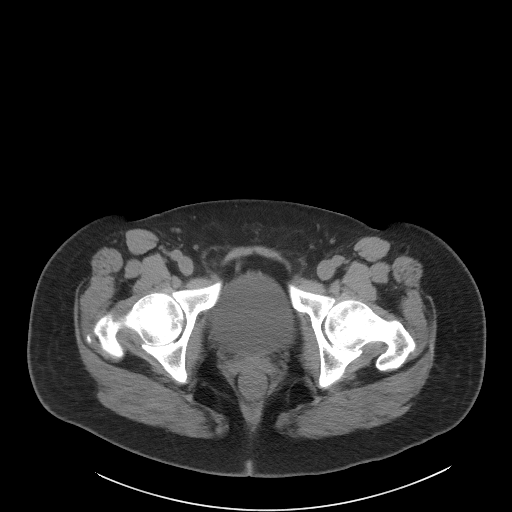
[im 25/87  soft-tissue]
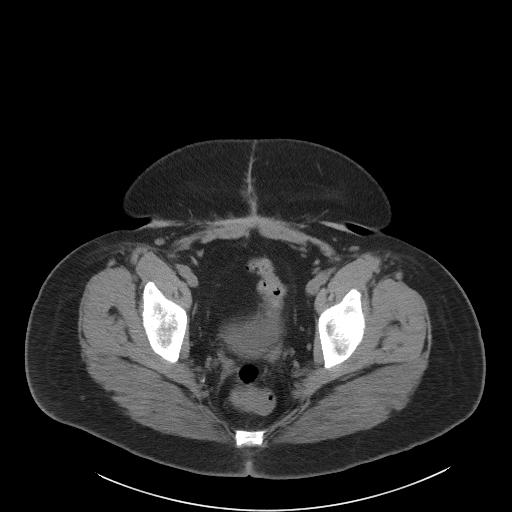
[im 28/87  soft-tissue]
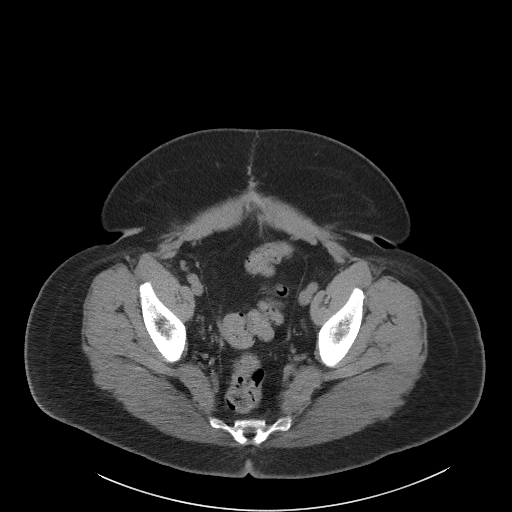
[im 35/87  soft-tissue]
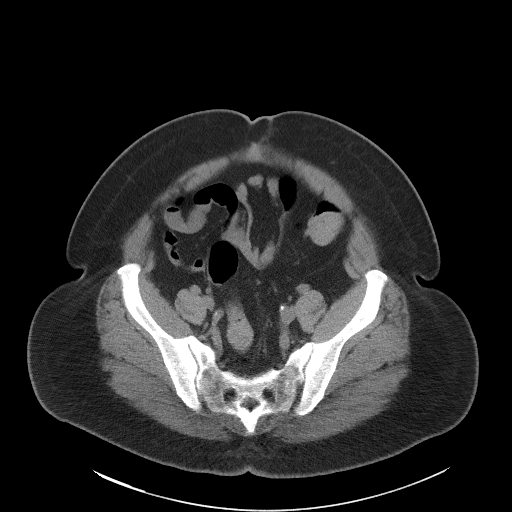
[im 42/87  soft-tissue]
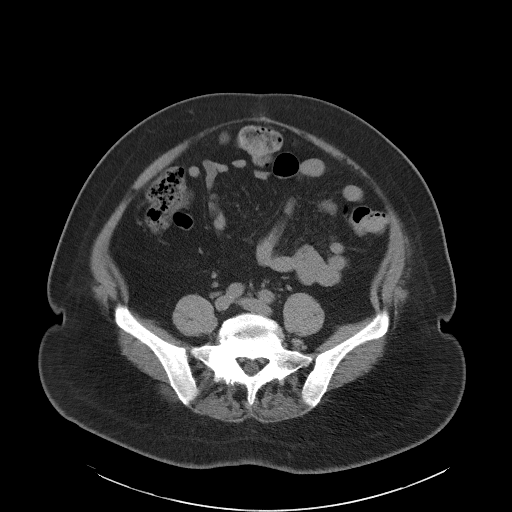
[im 45/87  soft-tissue]
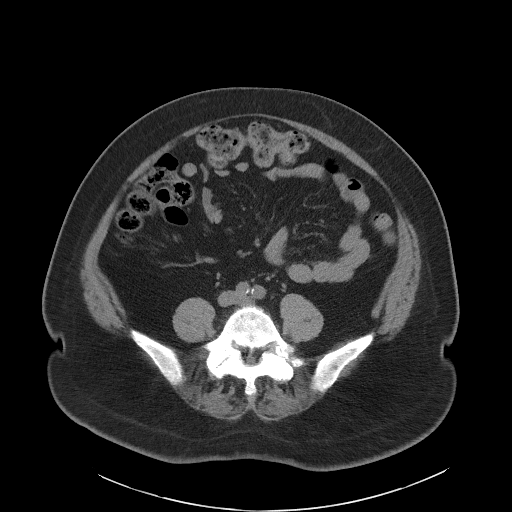
[im 52/87  soft-tissue]
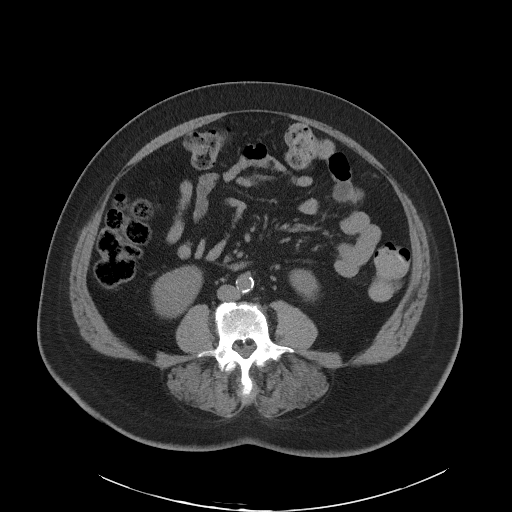
[im 52/87  bone]
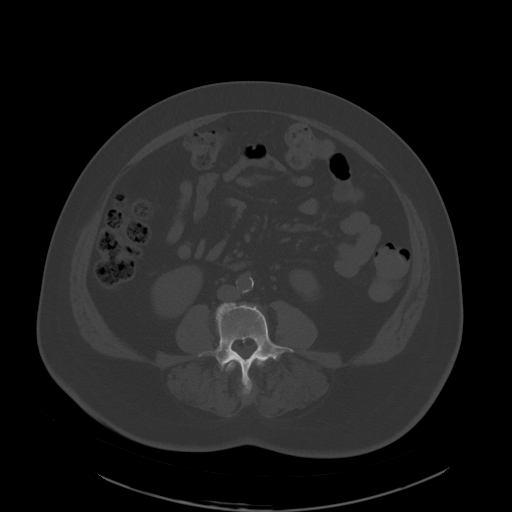
[im 59/87  soft-tissue]
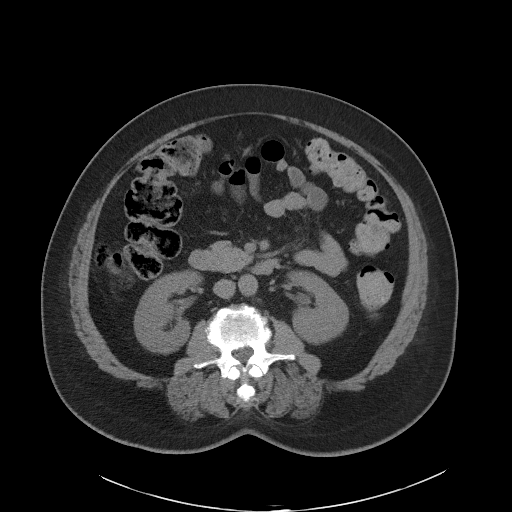
[im 66/87  soft-tissue]
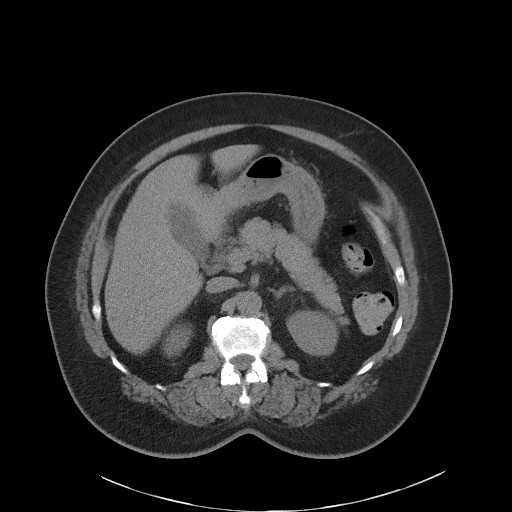
[im 69/87  soft-tissue]
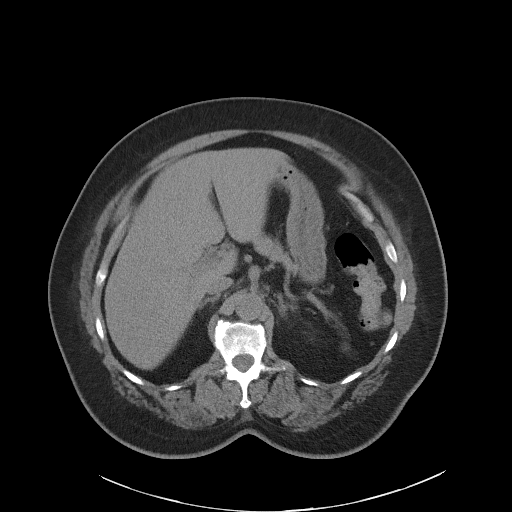
[im 76/87  soft-tissue]
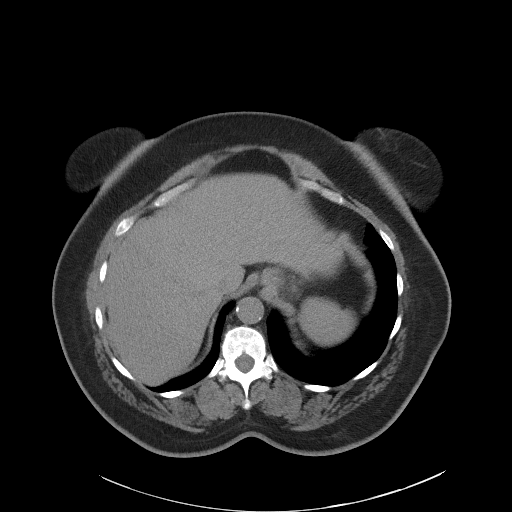
[im 83/87  soft-tissue]
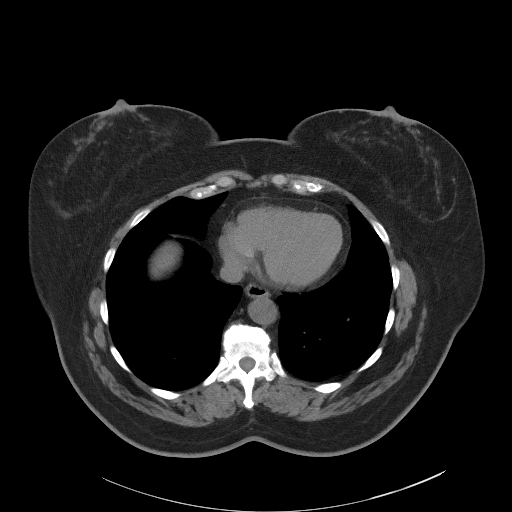

[Series 5: coronal st · coronal · 0.91mm/px · 3 of 104 slices shown]
[im 35/104  soft-tissue]
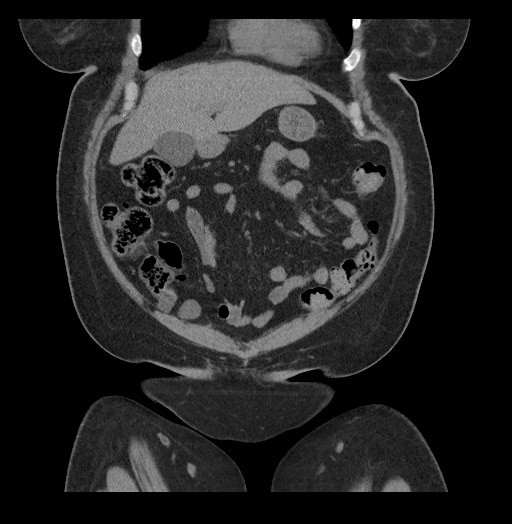
[im 46/104  soft-tissue]
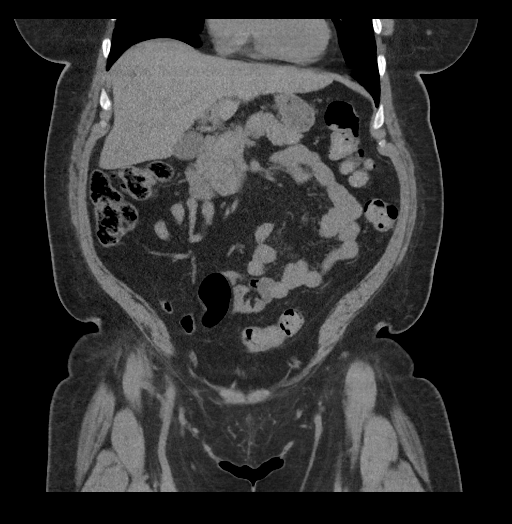
[im 58/104  soft-tissue]
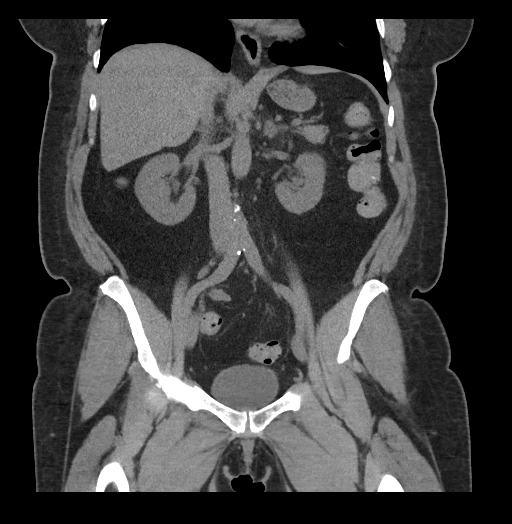

[17 of 46 positions shown; findings below may reference images not displayed]

FINDINGS: Lower chest: No acute abnormality.

Hepatobiliary: No focal liver abnormality is seen. No gallstones,
gallbladder wall thickening, or biliary dilatation.

Pancreas: Unremarkable. No pancreatic ductal dilatation or
surrounding inflammatory changes.

Spleen: Normal in size without focal abnormality.

Adrenals/Urinary Tract: Adrenal glands are unremarkable. Kidneys are
normal, without renal calculi, focal lesion, or hydronephrosis.
Bladder is unremarkable.

Stomach/Bowel: Stomach is within normal limits. Appendix is
surgically absent. No evidence of bowel wall thickening, distention,
or inflammatory changes. Moderate burden of stool in the colon.

Vascular/Lymphatic: Calcific atherosclerosis. No enlarged abdominal
or pelvic lymph nodes.

Reproductive: No mass or other abnormality. Status post
hysterectomy.

Other: No abdominal wall hernia or abnormality. No abdominopelvic
ascites.

Musculoskeletal: No acute or significant osseous findings. Severe
multilevel disc and facet degenerative disease of the lumbar spine.
IMPRESSION: 1. No acute noncontrast CT findings of the abdomen or pelvis to
explain left-sided hip or leg pain.

2. Severe multilevel disc and facet degenerative disease of the
lumbar spine, which may be further evaluated by MRI if indicated by
localizing signs and symptoms.

## 2021-06-11 ENCOUNTER — Other Ambulatory Visit: Payer: Self-pay | Admitting: Orthopaedic Surgery

## 2021-06-11 DIAGNOSIS — M51369 Other intervertebral disc degeneration, lumbar region without mention of lumbar back pain or lower extremity pain: Secondary | ICD-10-CM

## 2021-06-11 DIAGNOSIS — M4316 Spondylolisthesis, lumbar region: Secondary | ICD-10-CM

## 2021-06-11 DIAGNOSIS — M5136 Other intervertebral disc degeneration, lumbar region: Secondary | ICD-10-CM

## 2021-06-24 ENCOUNTER — Ambulatory Visit
Admission: RE | Admit: 2021-06-24 | Discharge: 2021-06-24 | Disposition: A | Discharge status: Home / Self Care | Payer: Medicare HMO | Source: Ambulatory Visit | Attending: Orthopaedic Surgery | Admitting: Orthopaedic Surgery

## 2021-06-24 ENCOUNTER — Other Ambulatory Visit: Payer: Self-pay

## 2021-06-24 DIAGNOSIS — M4316 Spondylolisthesis, lumbar region: Secondary | ICD-10-CM

## 2021-06-24 DIAGNOSIS — M5136 Other intervertebral disc degeneration, lumbar region: Secondary | ICD-10-CM
